# Patient Record
Sex: Male | Born: 1990 | Race: Black or African American | Hispanic: No | Marital: Single | Smoking: Former smoker
Health system: Southern US, Community
[De-identification: ages and names within clinical notes are randomized; demographics above are authoritative.]

---

## 2020-06-09 ENCOUNTER — Emergency Department (HOSPITAL_COMMUNITY)
Admission: EM | Admit: 2020-06-09 | Discharge: 2020-06-09 | Disposition: A | Discharge status: Home / Self Care | Attending: Emergency Medicine | Admitting: Emergency Medicine

## 2020-06-09 ENCOUNTER — Emergency Department (HOSPITAL_COMMUNITY)

## 2020-06-09 ENCOUNTER — Encounter (HOSPITAL_COMMUNITY): Payer: Self-pay | Admitting: Emergency Medicine

## 2020-06-09 ENCOUNTER — Other Ambulatory Visit: Payer: Self-pay

## 2020-06-09 DIAGNOSIS — W500XXA Accidental hit or strike by another person, initial encounter: Secondary | ICD-10-CM | POA: Insufficient documentation

## 2020-06-09 DIAGNOSIS — Y999 Unspecified external cause status: Secondary | ICD-10-CM | POA: Insufficient documentation

## 2020-06-09 DIAGNOSIS — Y9389 Activity, other specified: Secondary | ICD-10-CM | POA: Diagnosis not present

## 2020-06-09 DIAGNOSIS — S43004A Unspecified dislocation of right shoulder joint, initial encounter: Secondary | ICD-10-CM

## 2020-06-09 DIAGNOSIS — Y92149 Unspecified place in prison as the place of occurrence of the external cause: Secondary | ICD-10-CM | POA: Diagnosis not present

## 2020-06-09 MED ORDER — NAPROXEN 500 MG PO TABS
ORAL_TABLET | ORAL | 0 refills | Status: AC
Start: 2020-06-09 — End: ?

## 2020-06-09 MED ORDER — MORPHINE SULFATE (PF) 4 MG/ML IV SOLN
4.0000 mg | Freq: Once | INTRAVENOUS | Status: AC
  Administered 2020-06-09: 4 mg via INTRAVENOUS
  Filled 2020-06-09: qty 1

## 2020-06-09 MED ORDER — KETOROLAC TROMETHAMINE 30 MG/ML IJ SOLN
30.0000 mg | Freq: Once | INTRAMUSCULAR | Status: AC
  Administered 2020-06-09: 30 mg via INTRAVENOUS
  Filled 2020-06-09: qty 1

## 2020-06-09 MED ORDER — PROPOFOL 10 MG/ML IV BOLUS
200.0000 mg | Freq: Once | INTRAVENOUS | Status: AC
  Administered 2020-06-09: 200 mg via INTRAVENOUS
  Filled 2020-06-09: qty 20

## 2020-06-09 NOTE — Discharge Instructions (Signed)
Use ice packs on your shoulder for comfort.  Take the naproxen for pain.  Normally we recommend you wear the shoulder immobilizer for 2 weeks.  You need to give those shoulder tendons and ligaments that have been stretched from the shoulder being out of joint time to heal and strengthen and tighten back up.  Otherwise you will have your shoulder dislocate easily in the future.  We usually recommend you follow-up with an orthopedist in about 2 weeks to determine the everything has healed up adequately.
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
## 2021-05-06 IMAGING — DX DG SHOULDER 2+V*R*
2 series · 2 of 2 positions shown · non-contrast
Comparison: None.

CLINICAL DATA: Pain

EXAM:
RIGHT SHOULDER - 2+ VIEW

[shoulder swimmer (1 of 2)]
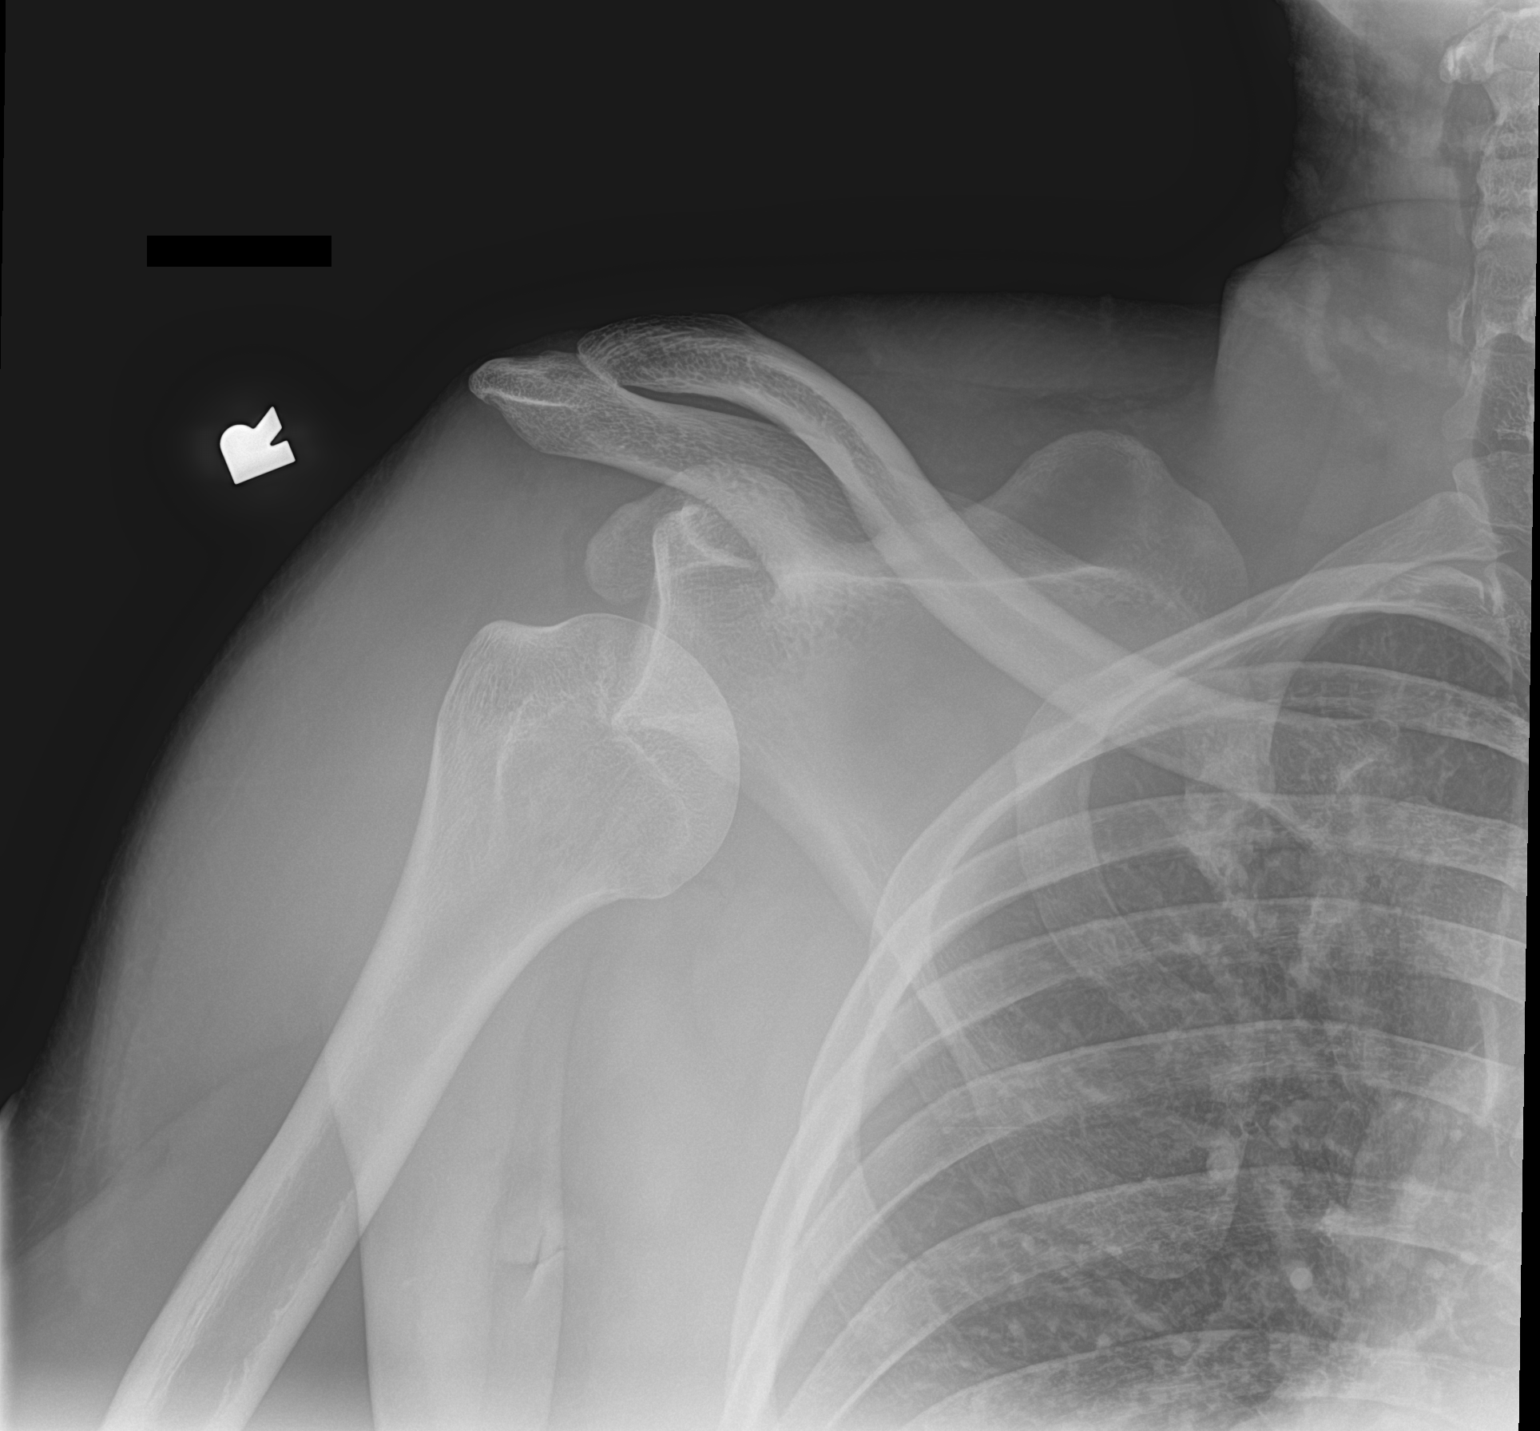

[shoulder swimmer (2 of 2)]
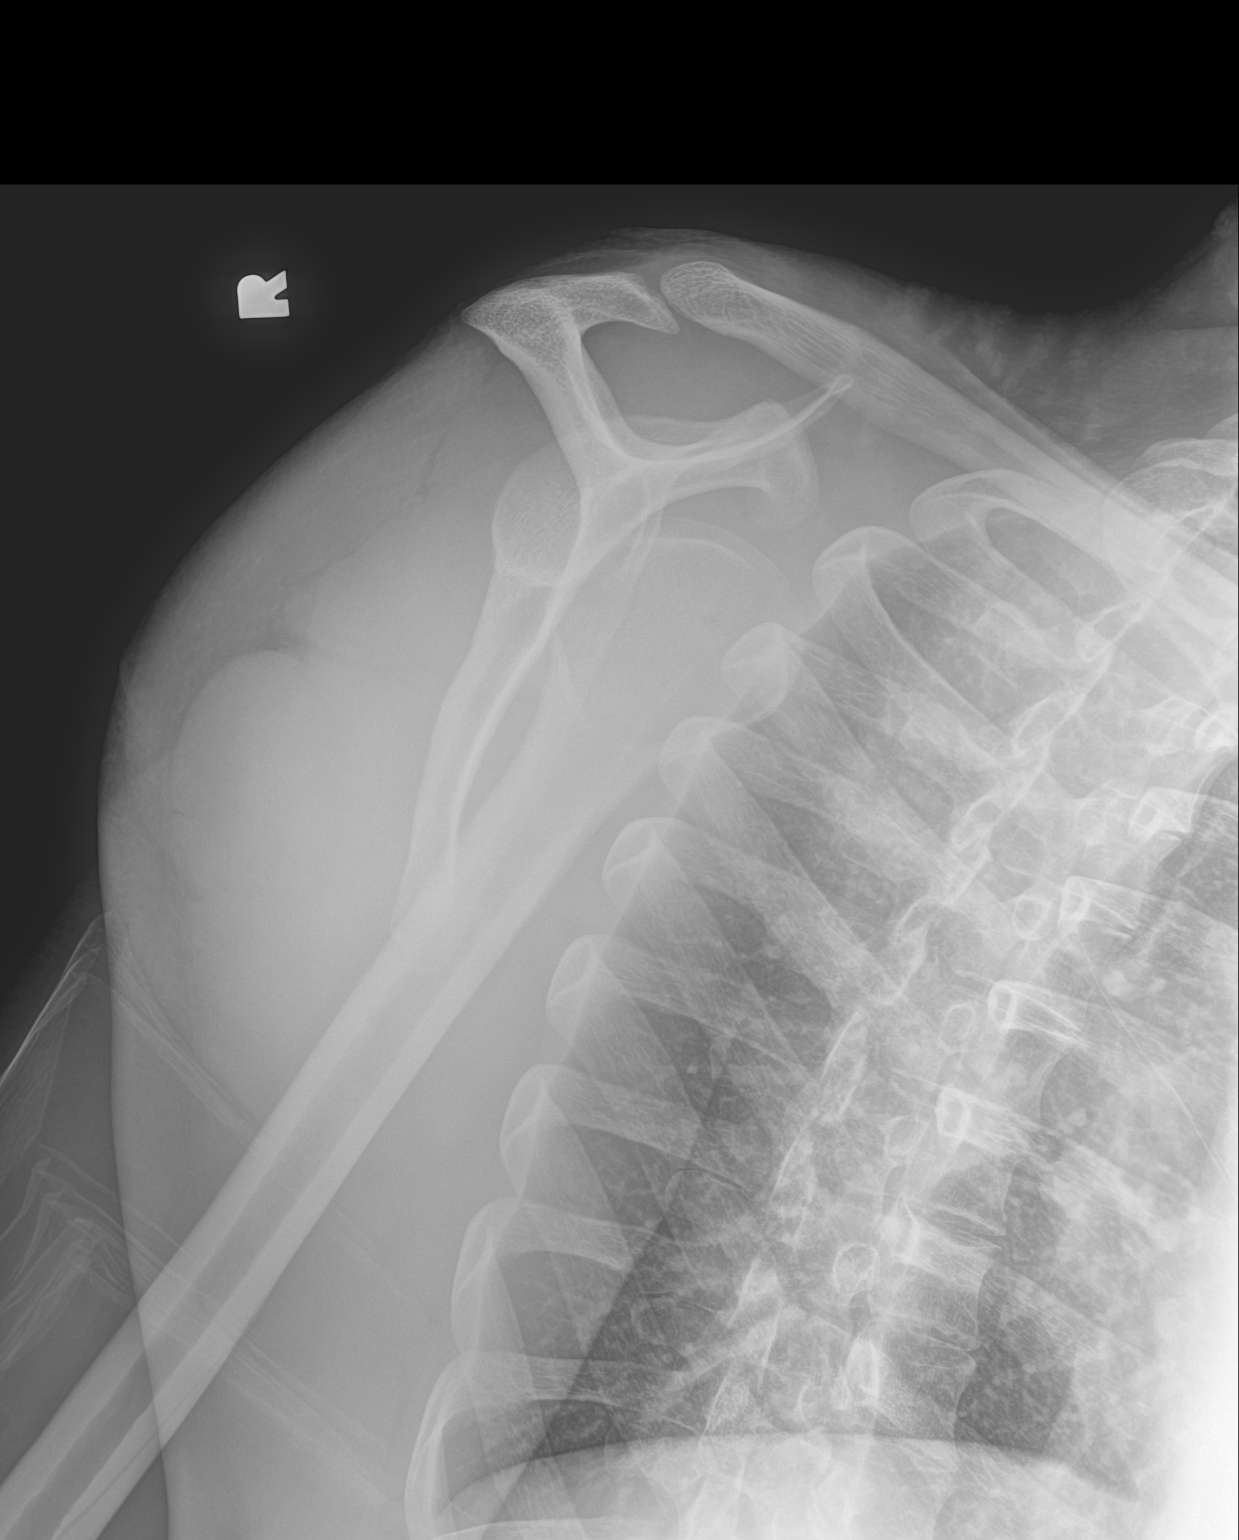

[2 of 2 positions shown; findings below may reference images not displayed]

FINDINGS: There is an anterior inferior glenohumeral dislocation. There is no
definite acute displaced fracture.
IMPRESSION: Anterior inferior glenohumeral dislocation.
